# Patient Record
Sex: Male | Born: 1980 | Race: White | Hispanic: No | Marital: Single | Smoking: Current some day smoker
Health system: Southern US, Community
[De-identification: ages and names within clinical notes are randomized; demographics above are authoritative.]

## PROBLEM LIST (undated history)

## (undated) DIAGNOSIS — I10 Essential (primary) hypertension: Secondary | ICD-10-CM

## (undated) DIAGNOSIS — M329 Systemic lupus erythematosus, unspecified: Secondary | ICD-10-CM

## (undated) HISTORY — PX: EYE SURGERY: SHX253

---

## 2017-01-23 ENCOUNTER — Inpatient Hospital Stay (HOSPITAL_COMMUNITY)
Admission: EM | Admit: 2017-01-23 | Discharge: 2017-01-28 | DRG: 516 | Disposition: A | Discharge status: Home / Self Care | Payer: Self-pay | Attending: Orthopaedic Surgery | Admitting: Orthopaedic Surgery

## 2017-01-23 ENCOUNTER — Encounter (HOSPITAL_COMMUNITY): Payer: Self-pay | Admitting: *Deleted

## 2017-01-23 ENCOUNTER — Emergency Department (HOSPITAL_COMMUNITY): Payer: Self-pay

## 2017-01-23 DIAGNOSIS — D72829 Elevated white blood cell count, unspecified: Secondary | ICD-10-CM | POA: Diagnosis present

## 2017-01-23 DIAGNOSIS — S32409A Unspecified fracture of unspecified acetabulum, initial encounter for closed fracture: Secondary | ICD-10-CM | POA: Diagnosis present

## 2017-01-23 DIAGNOSIS — M329 Systemic lupus erythematosus, unspecified: Secondary | ICD-10-CM | POA: Diagnosis present

## 2017-01-23 DIAGNOSIS — F17213 Nicotine dependence, cigarettes, with withdrawal: Secondary | ICD-10-CM | POA: Diagnosis present

## 2017-01-23 DIAGNOSIS — Z9114 Patient's other noncompliance with medication regimen: Secondary | ICD-10-CM

## 2017-01-23 DIAGNOSIS — E872 Acidosis: Secondary | ICD-10-CM | POA: Diagnosis present

## 2017-01-23 DIAGNOSIS — Y92410 Unspecified street and highway as the place of occurrence of the external cause: Secondary | ICD-10-CM

## 2017-01-23 DIAGNOSIS — Z419 Encounter for procedure for purposes other than remedying health state, unspecified: Secondary | ICD-10-CM

## 2017-01-23 DIAGNOSIS — I10 Essential (primary) hypertension: Secondary | ICD-10-CM | POA: Diagnosis present

## 2017-01-23 DIAGNOSIS — Z8249 Family history of ischemic heart disease and other diseases of the circulatory system: Secondary | ICD-10-CM

## 2017-01-23 DIAGNOSIS — S32421A Displaced fracture of posterior wall of right acetabulum, initial encounter for closed fracture: Principal | ICD-10-CM

## 2017-01-23 DIAGNOSIS — S0081XA Abrasion of other part of head, initial encounter: Secondary | ICD-10-CM

## 2017-01-23 DIAGNOSIS — L930 Discoid lupus erythematosus: Secondary | ICD-10-CM

## 2017-01-23 DIAGNOSIS — Z880 Allergy status to penicillin: Secondary | ICD-10-CM

## 2017-01-23 DIAGNOSIS — R739 Hyperglycemia, unspecified: Secondary | ICD-10-CM | POA: Diagnosis present

## 2017-01-23 DIAGNOSIS — F1010 Alcohol abuse, uncomplicated: Secondary | ICD-10-CM

## 2017-01-23 DIAGNOSIS — S060X0A Concussion without loss of consciousness, initial encounter: Secondary | ICD-10-CM

## 2017-01-23 DIAGNOSIS — Z720 Tobacco use: Secondary | ICD-10-CM

## 2017-01-23 DIAGNOSIS — S0181XA Laceration without foreign body of other part of head, initial encounter: Secondary | ICD-10-CM | POA: Diagnosis present

## 2017-01-23 HISTORY — DX: Systemic lupus erythematosus, unspecified: M32.9

## 2017-01-23 HISTORY — DX: Essential (primary) hypertension: I10

## 2017-01-23 LAB — URINALYSIS, ROUTINE W REFLEX MICROSCOPIC
Bacteria, UA: NONE SEEN
Bilirubin Urine: NEGATIVE
GLUCOSE, UA: NEGATIVE mg/dL
KETONES UR: NEGATIVE mg/dL
LEUKOCYTES UA: NEGATIVE
Nitrite: NEGATIVE
PROTEIN: NEGATIVE mg/dL
RBC / HPF: NONE SEEN RBC/hpf (ref 0–5)
SQUAMOUS EPITHELIAL / LPF: NONE SEEN
Specific Gravity, Urine: 1.002 — ABNORMAL LOW (ref 1.005–1.030)
WBC UA: NONE SEEN WBC/hpf (ref 0–5)
pH: 6 (ref 5.0–8.0)

## 2017-01-23 LAB — COMPREHENSIVE METABOLIC PANEL
ALBUMIN: 4.1 g/dL (ref 3.5–5.0)
ALK PHOS: 85 U/L (ref 38–126)
ALT: 58 U/L (ref 17–63)
AST: 55 U/L — AB (ref 15–41)
Anion gap: 10 (ref 5–15)
BILIRUBIN TOTAL: 0.4 mg/dL (ref 0.3–1.2)
BUN: 9 mg/dL (ref 6–20)
CALCIUM: 9 mg/dL (ref 8.9–10.3)
CO2: 20 mmol/L — ABNORMAL LOW (ref 22–32)
Chloride: 108 mmol/L (ref 101–111)
Creatinine, Ser: 1.01 mg/dL (ref 0.61–1.24)
GFR calc Af Amer: >60 mL/min (ref >60)
GFR calc non Af Amer: >60 mL/min (ref >60)
GLUCOSE: 119 mg/dL — AB (ref 65–99)
Potassium: 3.3 mmol/L — ABNORMAL LOW (ref 3.5–5.1)
Sodium: 138 mmol/L (ref 135–145)
TOTAL PROTEIN: 7 g/dL (ref 6.5–8.1)

## 2017-01-23 LAB — I-STAT CG4 LACTIC ACID, ED: LACTIC ACID, VENOUS: 2.54 mmol/L — AB (ref 0.5–1.9)

## 2017-01-23 LAB — SAMPLE TO BLOOD BANK

## 2017-01-23 LAB — CBC
HCT: 46.4 % (ref 39.0–52.0)
Hemoglobin: 15.9 g/dL (ref 13.0–17.0)
MCH: 30.7 pg (ref 26.0–34.0)
MCHC: 34.3 g/dL (ref 30.0–36.0)
MCV: 89.6 fL (ref 78.0–100.0)
Platelets: 229 10*3/uL (ref 150–400)
RBC: 5.18 MIL/uL (ref 4.22–5.81)
RDW: 13.3 % (ref 11.5–15.5)
WBC: 12.6 10*3/uL — ABNORMAL HIGH (ref 4.0–10.5)

## 2017-01-23 LAB — PROTIME-INR
INR: 0.9
Prothrombin Time: 12.1 seconds (ref 11.4–15.2)

## 2017-01-23 LAB — MRSA PCR SCREENING: MRSA BY PCR: NEGATIVE

## 2017-01-23 LAB — CDS SEROLOGY

## 2017-01-23 LAB — LACTIC ACID, PLASMA: LACTIC ACID, VENOUS: 1 mmol/L (ref 0.5–1.9)

## 2017-01-23 LAB — ETHANOL: ALCOHOL ETHYL (B): 229 mg/dL — AB (ref <5)

## 2017-01-23 MED ORDER — FENTANYL CITRATE (PF) 100 MCG/2ML IJ SOLN
100.0000 ug | Freq: Once | INTRAMUSCULAR | Status: AC
  Administered 2017-01-23: 100 ug via INTRAVENOUS
  Filled 2017-01-23: qty 2

## 2017-01-23 MED ORDER — ONDANSETRON HCL 4 MG PO TABS: 4.0000 mg | ORAL_TABLET | Freq: Four times a day (QID) | ORAL | Status: DC | PRN

## 2017-01-23 MED ORDER — DEXTROSE-NACL 5-0.45 % IV SOLN
INTRAVENOUS | Status: DC
  Administered 2017-01-23: 15:00:00 via INTRAVENOUS

## 2017-01-23 MED ORDER — ACETAMINOPHEN 325 MG PO TABS
650.0000 mg | ORAL_TABLET | Freq: Four times a day (QID) | ORAL | Status: DC | PRN
  Administered 2017-01-25 – 2017-01-28 (×6): 650 mg via ORAL
  Filled 2017-01-23 (×6): qty 2

## 2017-01-23 MED ORDER — MORPHINE SULFATE (PF) 4 MG/ML IV SOLN
2.0000 mg | INTRAVENOUS | Status: DC | PRN
  Administered 2017-01-23 – 2017-01-26 (×16): 2 mg via INTRAVENOUS
  Filled 2017-01-23 (×16): qty 1

## 2017-01-23 MED ORDER — SODIUM CHLORIDE 0.9 % IV BOLUS (SEPSIS)
1000.0000 mL | Freq: Once | INTRAVENOUS | Status: AC
  Administered 2017-01-23: 1000 mL via INTRAVENOUS

## 2017-01-23 MED ORDER — ONDANSETRON HCL 4 MG/2ML IJ SOLN: 4.0000 mg | Freq: Four times a day (QID) | INTRAMUSCULAR | Status: DC | PRN

## 2017-01-23 MED ORDER — HYDRALAZINE HCL 20 MG/ML IJ SOLN
5.0000 mg | INTRAMUSCULAR | Status: DC | PRN
  Administered 2017-01-23: 5 mg via INTRAVENOUS
  Administered 2017-01-24 (×2): 10 mg via INTRAVENOUS
  Filled 2017-01-23 (×3): qty 1

## 2017-01-23 MED ORDER — OXYCODONE HCL 5 MG PO TABS
5.0000 mg | ORAL_TABLET | ORAL | Status: DC | PRN
  Administered 2017-01-23 (×2): 15 mg via ORAL
  Administered 2017-01-23: 10 mg via ORAL
  Administered 2017-01-24 (×2): 15 mg via ORAL
  Administered 2017-01-24: 10 mg via ORAL
  Administered 2017-01-24 – 2017-01-25 (×3): 15 mg via ORAL
  Administered 2017-01-25: 10 mg via ORAL
  Administered 2017-01-25: 5 mg via ORAL
  Administered 2017-01-25 – 2017-01-28 (×18): 15 mg via ORAL
  Filled 2017-01-23: qty 2
  Filled 2017-01-23 (×5): qty 3
  Filled 2017-01-23: qty 2
  Filled 2017-01-23 (×22): qty 3

## 2017-01-23 MED ORDER — ACETAMINOPHEN 650 MG RE SUPP: 650.0000 mg | Freq: Four times a day (QID) | RECTAL | Status: DC | PRN

## 2017-01-23 MED ORDER — ENOXAPARIN SODIUM 30 MG/0.3ML ~~LOC~~ SOLN
30.0000 mg | Freq: Two times a day (BID) | SUBCUTANEOUS | Status: DC
  Administered 2017-01-23 – 2017-01-28 (×9): 30 mg via SUBCUTANEOUS
  Filled 2017-01-23 (×9): qty 0.3

## 2017-01-23 MED ORDER — DOCUSATE SODIUM 100 MG PO CAPS
100.0000 mg | ORAL_CAPSULE | Freq: Two times a day (BID) | ORAL | Status: DC
  Administered 2017-01-23 – 2017-01-27 (×11): 100 mg via ORAL
  Filled 2017-01-23 (×12): qty 1

## 2017-01-23 MED ORDER — FENTANYL CITRATE (PF) 100 MCG/2ML IJ SOLN
25.0000 ug | Freq: Once | INTRAMUSCULAR | Status: AC
  Administered 2017-01-23: 25 ug via INTRAVENOUS
  Filled 2017-01-23: qty 2

## 2017-01-23 MED ORDER — LISINOPRIL 5 MG PO TABS
5.0000 mg | ORAL_TABLET | Freq: Every day | ORAL | Status: DC
  Administered 2017-01-24 – 2017-01-28 (×5): 5 mg via ORAL
  Filled 2017-01-23 (×5): qty 1

## 2017-01-23 MED ORDER — POLYETHYLENE GLYCOL 3350 17 G PO PACK
17.0000 g | PACK | Freq: Every day | ORAL | Status: DC
  Administered 2017-01-23 – 2017-01-27 (×5): 17 g via ORAL
  Filled 2017-01-23 (×6): qty 1

## 2017-01-23 MED ORDER — ADULT MULTIVITAMIN W/MINERALS CH
1.0000 | ORAL_TABLET | Freq: Every day | ORAL | Status: DC
  Administered 2017-01-23 – 2017-01-28 (×6): 1 via ORAL
  Filled 2017-01-23 (×6): qty 1

## 2017-01-23 MED ORDER — LORAZEPAM 1 MG PO TABS
1.0000 mg | ORAL_TABLET | Freq: Four times a day (QID) | ORAL | Status: DC | PRN
  Administered 2017-01-24: 1 mg via ORAL
  Filled 2017-01-23: qty 1

## 2017-01-23 MED ORDER — TETANUS-DIPHTH-ACELL PERTUSSIS 5-2.5-18.5 LF-MCG/0.5 IM SUSP
0.5000 mL | Freq: Once | INTRAMUSCULAR | Status: AC
  Administered 2017-01-23: 0.5 mL via INTRAMUSCULAR
  Filled 2017-01-23: qty 0.5

## 2017-01-23 MED ORDER — THIAMINE HCL 100 MG/ML IJ SOLN
100.0000 mg | Freq: Every day | INTRAMUSCULAR | Status: DC
  Filled 2017-01-23: qty 2

## 2017-01-23 MED ORDER — IOPAMIDOL (ISOVUE-300) INJECTION 61%
INTRAVENOUS | Status: AC
  Administered 2017-01-23: 100 mL
  Filled 2017-01-23: qty 100

## 2017-01-23 MED ORDER — METOPROLOL TARTRATE 5 MG/5ML IV SOLN
5.0000 mg | Freq: Four times a day (QID) | INTRAVENOUS | Status: DC | PRN
Start: 2017-01-23 — End: 2017-01-28
  Administered 2017-01-23 – 2017-01-25 (×3): 5 mg via INTRAVENOUS
  Filled 2017-01-23 (×3): qty 5

## 2017-01-23 MED ORDER — METHOCARBAMOL 500 MG PO TABS
1000.0000 mg | ORAL_TABLET | Freq: Four times a day (QID) | ORAL | Status: DC | PRN
  Administered 2017-01-23 – 2017-01-28 (×15): 1000 mg via ORAL
  Filled 2017-01-23 (×15): qty 2

## 2017-01-23 MED ORDER — METHOCARBAMOL 1000 MG/10ML IJ SOLN
1000.0000 mg | Freq: Four times a day (QID) | INTRAVENOUS | Status: DC | PRN
  Filled 2017-01-23: qty 10

## 2017-01-23 MED ORDER — FOLIC ACID 1 MG PO TABS
1.0000 mg | ORAL_TABLET | Freq: Every day | ORAL | Status: DC
  Administered 2017-01-23 – 2017-01-28 (×6): 1 mg via ORAL
  Filled 2017-01-23 (×6): qty 1

## 2017-01-23 MED ORDER — VITAMIN B-1 100 MG PO TABS
100.0000 mg | ORAL_TABLET | Freq: Every day | ORAL | Status: DC
  Administered 2017-01-23 – 2017-01-28 (×6): 100 mg via ORAL
  Filled 2017-01-23 (×6): qty 1

## 2017-01-23 MED ORDER — LORAZEPAM 2 MG/ML IJ SOLN: 1.0000 mg | Freq: Four times a day (QID) | INTRAMUSCULAR | Status: DC | PRN

## 2017-01-23 NOTE — ED Notes (Signed)
Patient offered food, patient declined.
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
## 2017-01-23 NOTE — Progress Notes (Signed)
Orthopedic Tech Progress Note Patient Details:  Johnny Rush 04/25/1981 [id number]  Ortho Devices Type of Ortho Device: Knee Immobilizer Ortho Device/Splint Interventions: Application   Saul Fordyce 01/23/2017, 10:16 AM

## 2017-01-23 NOTE — ED Notes (Signed)
Family at beside  
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
## 2017-01-23 NOTE — ED Notes (Signed)
Ortho tech paged for right knee immobilizer.

## 2017-01-24 ENCOUNTER — Inpatient Hospital Stay (HOSPITAL_COMMUNITY): Payer: Self-pay

## 2017-01-24 ENCOUNTER — Encounter (HOSPITAL_COMMUNITY): Payer: Self-pay | Admitting: Certified Registered"

## 2017-01-24 ENCOUNTER — Encounter (HOSPITAL_COMMUNITY)
Admission: EM | Disposition: A | Discharge status: Home / Self Care | Payer: Self-pay | Source: Home / Self Care | Attending: Orthopaedic Surgery

## 2017-01-24 ENCOUNTER — Inpatient Hospital Stay (HOSPITAL_COMMUNITY): Payer: Self-pay | Admitting: Critical Care Medicine

## 2017-01-24 DIAGNOSIS — S32421A Displaced fracture of posterior wall of right acetabulum, initial encounter for closed fracture: Secondary | ICD-10-CM

## 2017-01-24 DIAGNOSIS — M329 Systemic lupus erythematosus, unspecified: Secondary | ICD-10-CM

## 2017-01-24 DIAGNOSIS — S32441D Displaced fracture of posterior column [ilioischial] of right acetabulum, subsequent encounter for fracture with routine healing: Secondary | ICD-10-CM | POA: Diagnosis not present

## 2017-01-24 HISTORY — PX: ORIF ACETABULAR FRACTURE: SHX5029

## 2017-01-24 LAB — HIV ANTIBODY (ROUTINE TESTING W REFLEX): HIV SCREEN 4TH GENERATION: NONREACTIVE

## 2017-01-24 LAB — HEMOGLOBIN A1C
Hgb A1c MFr Bld: 5.2 % (ref 4.8–5.6)
MEAN PLASMA GLUCOSE: 103 mg/dL

## 2017-01-24 SURGERY — OPEN REDUCTION INTERNAL FIXATION (ORIF) ACETABULAR FRACTURE
Anesthesia: General | Laterality: Right

## 2017-01-24 MED ORDER — HYDROMORPHONE HCL 1 MG/ML IJ SOLN
INTRAMUSCULAR | Status: AC
  Administered 2017-01-24: 0.5 mg via INTRAVENOUS
  Filled 2017-01-24: qty 1

## 2017-01-24 MED ORDER — HYDROMORPHONE HCL 1 MG/ML IJ SOLN
INTRAMUSCULAR | Status: AC
  Administered 2017-01-24: 0.5 mg via INTRAVENOUS
  Filled 2017-01-24: qty 0.5

## 2017-01-24 MED ORDER — ESMOLOL HCL 100 MG/10ML IV SOLN
INTRAVENOUS | Status: AC
  Filled 2017-01-24: qty 10

## 2017-01-24 MED ORDER — LACTATED RINGERS IV SOLN
Freq: Once | INTRAVENOUS | Status: AC
  Administered 2017-01-24: 13:00:00 via INTRAVENOUS

## 2017-01-24 MED ORDER — PROPOFOL 10 MG/ML IV BOLUS
INTRAVENOUS | Status: DC | PRN
  Administered 2017-01-24: 150 mg via INTRAVENOUS

## 2017-01-24 MED ORDER — LIDOCAINE 2% (20 MG/ML) 5 ML SYRINGE
INTRAMUSCULAR | Status: AC
  Filled 2017-01-24: qty 5

## 2017-01-24 MED ORDER — ROCURONIUM BROMIDE 10 MG/ML (PF) SYRINGE
PREFILLED_SYRINGE | INTRAVENOUS | Status: AC
  Filled 2017-01-24: qty 5

## 2017-01-24 MED ORDER — SUGAMMADEX SODIUM 200 MG/2ML IV SOLN
INTRAVENOUS | Status: AC
  Filled 2017-01-24: qty 2

## 2017-01-24 MED ORDER — ESMOLOL HCL 100 MG/10ML IV SOLN
INTRAVENOUS | Status: DC | PRN
  Administered 2017-01-24: 20 mg via INTRAVENOUS

## 2017-01-24 MED ORDER — OXYCODONE HCL 5 MG/5ML PO SOLN: 5.0000 mg | Freq: Once | ORAL | Status: AC | PRN

## 2017-01-24 MED ORDER — MIDAZOLAM HCL 2 MG/2ML IJ SOLN
INTRAMUSCULAR | Status: DC | PRN
  Administered 2017-01-24: 2 mg via INTRAVENOUS

## 2017-01-24 MED ORDER — PHENYLEPHRINE HCL 10 MG/ML IJ SOLN
INTRAMUSCULAR | Status: DC | PRN
  Administered 2017-01-24 (×2): 120 ug via INTRAVENOUS

## 2017-01-24 MED ORDER — FENTANYL CITRATE (PF) 250 MCG/5ML IJ SOLN
INTRAMUSCULAR | Status: AC
  Filled 2017-01-24: qty 5

## 2017-01-24 MED ORDER — PROPOFOL 10 MG/ML IV BOLUS
INTRAVENOUS | Status: AC
  Filled 2017-01-24: qty 20

## 2017-01-24 MED ORDER — SUGAMMADEX SODIUM 200 MG/2ML IV SOLN
INTRAVENOUS | Status: DC | PRN
  Administered 2017-01-24: 180 mg via INTRAVENOUS

## 2017-01-24 MED ORDER — PHENOL 1.4 % MT LIQD: 1.0000 | OROMUCOSAL | Status: DC | PRN

## 2017-01-24 MED ORDER — ONDANSETRON HCL 4 MG/2ML IJ SOLN
INTRAMUSCULAR | Status: AC
  Filled 2017-01-24: qty 2

## 2017-01-24 MED ORDER — EPHEDRINE 5 MG/ML INJ
INTRAVENOUS | Status: AC
  Filled 2017-01-24: qty 10

## 2017-01-24 MED ORDER — ROCURONIUM BROMIDE 100 MG/10ML IV SOLN
INTRAVENOUS | Status: DC | PRN
  Administered 2017-01-24: 50 mg via INTRAVENOUS
  Administered 2017-01-24: 30 mg via INTRAVENOUS

## 2017-01-24 MED ORDER — LACTATED RINGERS IV SOLN
INTRAVENOUS | Status: DC | PRN
  Administered 2017-01-24 (×2): via INTRAVENOUS

## 2017-01-24 MED ORDER — HYDROMORPHONE HCL 1 MG/ML IJ SOLN
0.2500 mg | INTRAMUSCULAR | Status: DC | PRN
  Administered 2017-01-24 (×4): 0.5 mg via INTRAVENOUS

## 2017-01-24 MED ORDER — CEFAZOLIN SODIUM-DEXTROSE 2-3 GM-% IV SOLR
INTRAVENOUS | Status: DC | PRN
  Administered 2017-01-24: 2 g via INTRAVENOUS

## 2017-01-24 MED ORDER — MENTHOL 3 MG MT LOZG: 1.0000 | LOZENGE | OROMUCOSAL | Status: DC | PRN

## 2017-01-24 MED ORDER — 0.9 % SODIUM CHLORIDE (POUR BTL) OPTIME
TOPICAL | Status: DC | PRN
  Administered 2017-01-24: 1000 mL

## 2017-01-24 MED ORDER — FENTANYL CITRATE (PF) 100 MCG/2ML IJ SOLN
INTRAMUSCULAR | Status: DC | PRN
  Administered 2017-01-24 (×2): 50 ug via INTRAVENOUS
  Administered 2017-01-24 (×2): 100 ug via INTRAVENOUS
  Administered 2017-01-24: 50 ug via INTRAVENOUS
  Administered 2017-01-24: 100 ug via INTRAVENOUS
  Administered 2017-01-24: 50 ug via INTRAVENOUS

## 2017-01-24 MED ORDER — BACITRACIN-NEOMYCIN-POLYMYXIN 400-5-5000 EX OINT
TOPICAL_OINTMENT | Freq: Every day | CUTANEOUS | Status: DC
  Administered 2017-01-24: 1 via TOPICAL
  Administered 2017-01-25: 09:00:00 via TOPICAL
  Administered 2017-01-26 – 2017-01-28 (×3): 1 via TOPICAL
  Filled 2017-01-24 (×4): qty 1

## 2017-01-24 MED ORDER — OXYCODONE HCL 5 MG PO TABS
ORAL_TABLET | ORAL | Status: AC
  Administered 2017-01-24: 5 mg via ORAL
  Filled 2017-01-24: qty 1

## 2017-01-24 MED ORDER — MIDAZOLAM HCL 2 MG/2ML IJ SOLN
INTRAMUSCULAR | Status: AC
  Filled 2017-01-24: qty 2

## 2017-01-24 MED ORDER — CEFAZOLIN SODIUM-DEXTROSE 2-4 GM/100ML-% IV SOLN
INTRAVENOUS | Status: AC
  Filled 2017-01-24: qty 100

## 2017-01-24 MED ORDER — OXYCODONE HCL 5 MG PO TABS
5.0000 mg | ORAL_TABLET | Freq: Once | ORAL | Status: AC | PRN
  Administered 2017-01-24: 5 mg via ORAL

## 2017-01-24 MED ORDER — PHENYLEPHRINE 40 MCG/ML (10ML) SYRINGE FOR IV PUSH (FOR BLOOD PRESSURE SUPPORT)
PREFILLED_SYRINGE | INTRAVENOUS | Status: AC
  Filled 2017-01-24: qty 20

## 2017-01-24 MED ORDER — EPHEDRINE SULFATE 50 MG/ML IJ SOLN
INTRAMUSCULAR | Status: DC | PRN
  Administered 2017-01-24 (×2): 10 mg via INTRAVENOUS

## 2017-01-24 MED ORDER — LIDOCAINE HCL (CARDIAC) 20 MG/ML IV SOLN
INTRAVENOUS | Status: DC | PRN
  Administered 2017-01-24: 100 mg via INTRAVENOUS

## 2017-01-24 MED ORDER — ONDANSETRON HCL 4 MG/2ML IJ SOLN
INTRAMUSCULAR | Status: DC | PRN
  Administered 2017-01-24: 4 mg via INTRAVENOUS

## 2017-01-24 MED ORDER — PHENYLEPHRINE HCL 10 MG/ML IJ SOLN
INTRAVENOUS | Status: DC | PRN
  Administered 2017-01-24: 120 ug/min via INTRAVENOUS

## 2017-01-24 SURGICAL SUPPLY — 68 items
BIT DRILL 2.5 X LONG (BIT) ×1
BIT DRILL X LONG 2.5 (BIT) ×1 IMPLANT
BLADE CLIPPER SURG (BLADE) ×3 IMPLANT
COVER SURGICAL LIGHT HANDLE (MISCELLANEOUS) ×3 IMPLANT
DRAPE C-ARM 42X72 X-RAY (DRAPES) IMPLANT
DRAPE C-ARMOR (DRAPES) ×3 IMPLANT
DRAPE IMP U-DRAPE 54X76 (DRAPES) ×3 IMPLANT
DRAPE INCISE IOBAN 66X45 STRL (DRAPES) IMPLANT
DRAPE INCISE IOBAN 85X60 (DRAPES) ×3 IMPLANT
DRAPE ORTHO SPLIT 77X108 STRL (DRAPES) ×4
DRAPE SURG ORHT 6 SPLT 77X108 (DRAPES) ×2 IMPLANT
DRAPE U-SHAPE 47X51 STRL (DRAPES) ×3 IMPLANT
DRILL BIT X LONG 2.5 (BIT) ×2
DRSG ADAPTIC 3X8 NADH LF (GAUZE/BANDAGES/DRESSINGS) ×3 IMPLANT
DRSG AQUACEL AG ADV 3.5X14 (GAUZE/BANDAGES/DRESSINGS) ×3 IMPLANT
DRSG PAD ABDOMINAL 8X10 ST (GAUZE/BANDAGES/DRESSINGS) IMPLANT
DURAPREP 26ML APPLICATOR (WOUND CARE) ×6 IMPLANT
ELECT BLADE 4.0 EZ CLEAN MEGAD (MISCELLANEOUS) ×3
ELECT REM PT RETURN 9FT ADLT (ELECTROSURGICAL) ×3
ELECTRODE BLDE 4.0 EZ CLN MEGD (MISCELLANEOUS) ×1 IMPLANT
ELECTRODE REM PT RTRN 9FT ADLT (ELECTROSURGICAL) ×1 IMPLANT
EVACUATOR 1/8 PVC DRAIN (DRAIN) IMPLANT
GAUZE SPONGE 4X4 12PLY STRL (GAUZE/BANDAGES/DRESSINGS) ×3 IMPLANT
GLOVE BIOGEL PI IND STRL 8 (GLOVE) ×2 IMPLANT
GLOVE BIOGEL PI INDICATOR 8 (GLOVE) ×4
GLOVE ORTHO TXT STRL SZ7.5 (GLOVE) ×6 IMPLANT
GOWN STRL REUS W/ TWL LRG LVL3 (GOWN DISPOSABLE) ×1 IMPLANT
GOWN STRL REUS W/ TWL XL LVL3 (GOWN DISPOSABLE) ×1 IMPLANT
GOWN STRL REUS W/TWL 2XL LVL3 (GOWN DISPOSABLE) ×3 IMPLANT
GOWN STRL REUS W/TWL LRG LVL3 (GOWN DISPOSABLE) ×2
GOWN STRL REUS W/TWL XL LVL3 (GOWN DISPOSABLE) ×2
KIT BASIN OR (CUSTOM PROCEDURE TRAY) ×3 IMPLANT
KIT ROOM TURNOVER OR (KITS) ×3 IMPLANT
LOOP VESSEL MAXI BLUE (MISCELLANEOUS) IMPLANT
MANIFOLD NEPTUNE II (INSTRUMENTS) ×3 IMPLANT
NEEDLE MAYO TROCAR (NEEDLE) ×3 IMPLANT
NS IRRIG 1000ML POUR BTL (IV SOLUTION) ×3 IMPLANT
PACK TOTAL JOINT (CUSTOM PROCEDURE TRAY) ×3 IMPLANT
PACK UNIVERSAL I (CUSTOM PROCEDURE TRAY) IMPLANT
PAD ARMBOARD 7.5X6 YLW CONV (MISCELLANEOUS) ×6 IMPLANT
PLATE LCP RECON 3.5 8H/112 (Plate) ×3 IMPLANT
SCREW CORTEX 3.5 24MM (Screw) ×2 IMPLANT
SCREW CORTEX 3.5 26MM (Screw) ×2 IMPLANT
SCREW CORTEX 3.5 32MM (Screw) ×4 IMPLANT
SCREW CORTEX 3.5 34MM (Screw) ×2 IMPLANT
SCREW CORTEX 3.5 40MM (Screw) ×2 IMPLANT
SCREW LOCK CORT ST 3.5X24 (Screw) ×1 IMPLANT
SCREW LOCK CORT ST 3.5X26 (Screw) ×1 IMPLANT
SCREW LOCK CORT ST 3.5X32 (Screw) ×2 IMPLANT
SCREW LOCK CORT ST 3.5X34 (Screw) ×1 IMPLANT
SCREW LOCK CORT ST 3.5X40 (Screw) ×1 IMPLANT
SPONGE LAP 18X18 X RAY DECT (DISPOSABLE) IMPLANT
STAPLER VISISTAT 35W (STAPLE) ×3 IMPLANT
SUCTION FRAZIER HANDLE 10FR (MISCELLANEOUS) ×2
SUCTION TUBE FRAZIER 10FR DISP (MISCELLANEOUS) ×1 IMPLANT
SUT ETHIBOND NAB CT1 #1 30IN (SUTURE) ×3 IMPLANT
SUT VIC AB 0 CT1 27 (SUTURE) ×4
SUT VIC AB 0 CT1 27XBRD ANBCTR (SUTURE) ×2 IMPLANT
SUT VIC AB 1 CT1 27 (SUTURE) ×2
SUT VIC AB 1 CT1 27XBRD ANTBC (SUTURE) ×1 IMPLANT
SUT VIC AB 2-0 CT1 27 (SUTURE) ×4
SUT VIC AB 2-0 CT1 TAPERPNT 27 (SUTURE) ×2 IMPLANT
SUT VLOC 180 0 24IN GS25 (SUTURE) ×3 IMPLANT
SYR 5ML LL (SYRINGE) IMPLANT
TOWEL OR 17X24 6PK STRL BLUE (TOWEL DISPOSABLE) ×3 IMPLANT
TOWEL OR 17X26 10 PK STRL BLUE (TOWEL DISPOSABLE) ×3 IMPLANT
TRAY FOLEY W/METER SILVER 16FR (SET/KITS/TRAYS/PACK) ×3 IMPLANT
WATER STERILE IRR 1000ML POUR (IV SOLUTION) IMPLANT

## 2017-01-24 NOTE — Progress Notes (Signed)
Orthopedic Tech Progress Note Patient Details:  Johnny Rush 11-Feb-1981 [id number]  Ortho Devices Type of Ortho Device: Knee Immobilizer Ortho Device/Splint Location: RLE Ortho Device/Splint Interventions: Application   [name] 01/24/2017, 4:19 PM

## 2017-01-24 NOTE — Progress Notes (Signed)
   Subjective:   Procedure(s) (LRB): OPEN REDUCTION INTERNAL FIXATION (ORIF) ACETABULAR FRACTURE (Right) Patient reports pain as severe.  Patient and sister expressed problems with pain meds, spilled urinal on himself.  C/O left side ribs sore and right hip pain.   Objective: Vital signs in last 24 hours: Temp:  [98.2 F (36.8 C)-98.6 F (37 C)] 98.2 F (36.8 C) (05/02 0616) Pulse Rate:  [71-110] 85 (05/02 0616) Resp:  [10-20] 17 (05/02 0616) BP: (126-180)/(76-119) 180/92 (05/02 0616) SpO2:  [94 %-99 %] 99 % (05/02 0616)  Intake/Output from previous day: 05/01 0701 - 05/02 0700 In: 405.8 [P.O.:360; I.V.:45.8] Out: 1050 [Urine:1050] Intake/Output this shift: No intake/output data recorded.   Recent Labs  01/23/17 0419  HGB 15.9    Recent Labs  01/23/17 0419  WBC 12.6*  RBC 5.18  HCT 46.4  PLT 229    Recent Labs  01/23/17 0419  NA 138  K 3.3*  CL 108  CO2 20*  BUN 9  CREATININE 1.01  GLUCOSE 119*  CALCIUM 9.0    Recent Labs  01/23/17 0419  INR 0.90    Neurologically intact sciatic sensory and motor intact.  No results found.  Assessment/Plan:   Procedure(s) (LRB): OPEN REDUCTION INTERNAL FIXATION (ORIF) ACETABULAR FRACTURE (Right) Plan:   Surgery today.  Risks discussed including AVN, hip OA, problems with screws.  Eldred Manges 01/24/2017, 7:46 AM

## 2017-01-24 NOTE — Progress Notes (Signed)
Patient came back from surgery around 1630. Patient stated that he had a large bruise on his arm upper left arm that was not present before he went to surgery. Nurse will notify MD. Nurse will continue to monitor

## 2017-01-24 NOTE — Anesthesia Procedure Notes (Signed)
Procedure Name: Intubation Date/Time: 01/24/2017 1:06 PM Performed by: Sampson Si E Pre-anesthesia Checklist: Patient identified, Emergency Drugs available, Suction available and Patient being monitored Patient Re-evaluated:Patient Re-evaluated prior to inductionOxygen Delivery Method: Circle System Utilized Preoxygenation: Pre-oxygenation with 100% oxygen Intubation Type: IV induction Ventilation: Mask ventilation without difficulty Laryngoscope Size: Mac and 4 Grade View: Grade I Tube type: Oral Tube size: 7.5 mm Number of attempts: 1 Airway Equipment and Method: Stylet Placement Confirmation: ETT inserted through vocal cords under direct vision,  positive ETCO2 and breath sounds checked- equal and bilateral Secured at: 21 cm Tube secured with: Tape Dental Injury: Teeth and Oropharynx as per pre-operative assessment

## 2017-01-24 NOTE — Op Note (Signed)
Preop diagnosis: Right hip posterior wall comminuted acetabular fracture, closed area  Postop diagnosis: Same  Procedure: Open reduction internal fixation right posterior comminuted acetabular wall fracture.  Surgeon: Annell Greening M.D.  Assistant: Zonia Kief PA-C medically necessary and present for the entire procedure  Anesthesia general  EBL: See anesthetic record estimated less than 200 mL.  Implants Synthes compression plate with the screws 7  Procedure after induction general anesthesia patient placed on the [facility] table for fluoroscopic visualization in the lateral position with axillary roll in the semilateral position slightly turned a little bit more prone. 10:15 drape was applied the clippers were used for the hair over the buttocks. DuraPrep down close the ankle and usual impervious stockinette split sheets drapes sterile skin marker and Betadine Steri-Drape 2 sealing the skin preoperative Ancef timeout procedure completed. A Coker Laugenbach posterior incision was made. Piriformis was tagged and elevated protecting the sciatic nerve which was identified. Posterior comminuted wall fragments were reduced there was 1 large fragment a second smaller fragment which is initially removed once a large fragment was placed a small fragment was able to be keyed in and impacted into position underneath the position for the plate. Synthes plate was custom the bent to fit the posterior wall extending down initial tuberosity were initial screw was placed checked under fluoroscopy second screw placed distally and then proximal screws were placed above the joint pulling the plate down directly to the pelvis and compressing the fragment in anatomic position which was held initially with the reduction with the pig sticker and then secured with a K wire. K wire was removed is plate was tightened down. Proximal 3 screws were bicortical and a second screw was placed bicortically slightly more posterior  avoiding the joint and Kitching the medial wall to additionally secure the largest fragment. Numerous fluoroscopic pictures were taken the trying to reproduce at [place] views. Proximal all screws were tightened down to the plate was sitting down on the cortex. Final spot pictures were taken copious irrigation operative field was dry sciatic nerve was protected. The small Hohmann been placed in the sciatic notch to help protect the ulnar nerve. Piriformis was repaired gluteus medius tendon. Operative field was dry and tensor fascia was repaired as well as gluteus maximus the fascia. 2-0 Vicryl subtendinous tissue skin staple closure postop dressing and transferred recovery room. Patient will be protective weightbearing touchdown weightbearing only for a number weeks. Patient tolerated procedure well and transferred to the recovery room.

## 2017-01-24 NOTE — Anesthesia Preprocedure Evaluation (Signed)
Anesthesia Evaluation  Patient identified by MRN, date of birth, ID band Patient awake    Reviewed: Allergy & Precautions, NPO status , Unable to perform ROS - Chart review only  Airway Mallampati: II   Neck ROM: Full    Dental  (+) Poor Dentition, Missing, Dental Advisory Given   Pulmonary Current Smoker,    breath sounds clear to auscultation       Cardiovascular hypertension,  Rhythm:Regular Rate:Normal     Neuro/Psych    GI/Hepatic (+)     substance abuse  alcohol use,   Endo/Other    Renal/GU      Musculoskeletal   Abdominal   Peds  Hematology   Anesthesia Other Findings   Reproductive/Obstetrics                             Anesthesia Physical Anesthesia Plan  ASA: II  Anesthesia Plan: General   Post-op Pain Management:    Induction: Intravenous  Airway Management Planned: Oral ETT  Additional Equipment:   Intra-op Plan:   Post-operative Plan: Extubation in OR  Informed Consent: I have reviewed the patients History and Physical, chart, labs and discussed the procedure including the risks, benefits and alternatives for the proposed anesthesia with the patient or authorized representative who has indicated his/her understanding and acceptance.   Dental advisory given  Plan Discussed with:   Anesthesia Plan Comments:         Anesthesia Quick Evaluation

## 2017-01-24 NOTE — Brief Op Note (Signed)
01/23/2017 - 01/24/2017  3:47 PM  PATIENT:  Johnny Rush.  36 y.o. male  PRE-OPERATIVE DIAGNOSIS:  RIGHT POSTERIOR ACETABULAR FRACTURE  POST-OPERATIVE DIAGNOSIS:  RIGHT POSTERIOR ACETABULAR FRACTURE  PROCEDURE:  Procedure(s): OPEN REDUCTION INTERNAL FIXATION (ORIF) ACETABULAR FRACTURE (Right)  SURGEON:  Surgeon(s) and Role:    * Eldred Manges, MD - Primary  PHYSICIAN ASSISTANT: Zonia Kief    ANESTHESIA:   general  EBL:  Total I/O In: 1000 [I.V.:1000] Out: 500 [Urine:100; Blood:400]  BLOOD ADMINISTERED:none  DRAINS: none   LOCAL MEDICATIONS USED:  NONE  SPECIMEN:  No Specimen  DISPOSITION OF SPECIMEN:  N/A  COUNTS:  YES  TOURNIQUET:  * No tourniquets in log *  DICTATION: .Dragon Dictation  PLAN OF CARE: Admit to inpatient   PATIENT DISPOSITION:  PACU - hemodynamically stable.

## 2017-01-24 NOTE — Interval H&P Note (Signed)
History and Physical Interval Note:  01/24/2017 12:50 PM  Johnny Rush.  has presented today for surgery, with the diagnosis of RIGHT POSTERIOR ACETABULAR FRACTURE  The various methods of treatment have been discussed with the patient and family. After consideration of risks, benefits and other options for treatment, the patient has consented to  Procedure(s): OPEN REDUCTION INTERNAL FIXATION (ORIF) ACETABULAR FRACTURE (Right) as a surgical intervention .  The patient's history has been reviewed, patient examined, no change in status, stable for surgery.  I have reviewed the patient's chart and labs.  Questions were answered to the patient's satisfaction.     Eldred Manges
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
## 2017-01-24 NOTE — Transfer of Care (Signed)
Immediate Anesthesia Transfer of Care Note  Patient: Johnny Rush.  Procedure(s) Performed: Procedure(s): OPEN REDUCTION INTERNAL FIXATION (ORIF) ACETABULAR FRACTURE (Right)  Patient Location: PACU  Anesthesia Type:General  Level of Consciousness: awake and oriented  Airway & Oxygen Therapy: Patient Spontanous Breathing and Patient connected to nasal cannula oxygen  Post-op Assessment: Report given to RN and Patient moving all extremities X 4  Post vital signs: Reviewed and stable  Last Vitals:  Vitals:   01/24/17 0616 01/24/17 1139  BP: (!) 180/92 (!) 157/84  Pulse: 85 93  Resp: 17 15  Temp: 36.8 C 36.7 C    Last Pain:  Vitals:   01/24/17 1139  TempSrc: Oral  PainSc:          Complications: No apparent anesthesia complications

## 2017-01-25 ENCOUNTER — Encounter (HOSPITAL_COMMUNITY): Payer: Self-pay | Admitting: Orthopaedic Surgery

## 2017-01-25 LAB — BASIC METABOLIC PANEL
Anion gap: 8 (ref 5–15)
BUN: 8 mg/dL (ref 6–20)
CALCIUM: 8.3 mg/dL — AB (ref 8.9–10.3)
CHLORIDE: 100 mmol/L — AB (ref 101–111)
CO2: 27 mmol/L (ref 22–32)
CREATININE: 1.11 mg/dL (ref 0.61–1.24)
GFR calc non Af Amer: >60 mL/min (ref >60)
Glucose, Bld: 109 mg/dL — ABNORMAL HIGH (ref 65–99)
Potassium: 4.1 mmol/L (ref 3.5–5.1)
SODIUM: 135 mmol/L (ref 135–145)

## 2017-01-25 LAB — CBC
HCT: 40.7 % (ref 39.0–52.0)
Hemoglobin: 13.4 g/dL (ref 13.0–17.0)
MCH: 30.4 pg (ref 26.0–34.0)
MCHC: 32.9 g/dL (ref 30.0–36.0)
MCV: 92.3 fL (ref 78.0–100.0)
PLATELETS: 197 10*3/uL (ref 150–400)
RBC: 4.41 MIL/uL (ref 4.22–5.81)
RDW: 13.8 % (ref 11.5–15.5)
WBC: 11.8 10*3/uL — ABNORMAL HIGH (ref 4.0–10.5)

## 2017-01-25 MED ORDER — ZOLPIDEM TARTRATE 5 MG PO TABS
5.0000 mg | ORAL_TABLET | Freq: Every evening | ORAL | Status: DC | PRN
  Administered 2017-01-25 – 2017-01-27 (×4): 5 mg via ORAL
  Filled 2017-01-25 (×4): qty 1
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
## 2017-01-25 NOTE — Progress Notes (Addendum)
   Subjective: 1 Day Post-Op Procedure(s) (LRB): OPEN REDUCTION INTERNAL FIXATION (ORIF) ACETABULAR FRACTURE (Right) Patient reports pain as moderate and severe.    Objective: Vital signs in last 24 hours: Temp:  [97.7 F (36.5 C)-98.7 F (37.1 C)] 98.5 F (36.9 C) (05/03 0436) Pulse Rate:  [93-123] 98 (05/03 0436) Resp:  [12-16] 14 (05/03 0436) BP: (135-165)/(68-94) 135/72 (05/03 0436) SpO2:  [94 %-100 %] 98 % (05/03 0436)  Intake/Output from previous day: 05/02 0701 - 05/03 0700 In: 1227 [P.O.:222; I.V.:1005] Out: 2300 [Urine:1900; Blood:400] Intake/Output this shift: No intake/output data recorded.   Recent Labs  01/23/17 0419 01/25/17 0440  HGB 15.9 13.4    Recent Labs  01/23/17 0419 01/25/17 0440  WBC 12.6* 11.8*  RBC 5.18 4.41  HCT 46.4 40.7  PLT 229 197    Recent Labs  01/23/17 0419 01/25/17 0440  NA 138 135  K 3.3* 4.1  CL 108 100*  CO2 20* 27  BUN 9 8  CREATININE 1.01 1.11  GLUCOSE 119* 109*  CALCIUM 9.0 8.3*    Recent Labs  01/23/17 0419  INR 0.90    Neurologically intact Dg Pelvis Comp Min 3v  Result Date: 01/24/2017 CLINICAL DATA:  36 year old male undergoing ORIF of right acetabulum fracture EXAM: JUDET PELVIS - 3+ VIEW; DG C-ARM 61-120 MIN COMPARISON:  CT Abdomen and Pelvis 07-May-202018 FINDINGS: 3 intraoperative fluoroscopic spot views demonstrate malleable plate and screws about the right acetabulum. A superimposed single cortical screw is also in place. No adverse hardware features identified. On image 2 comminution fragment alignment about the superior acetabulum appears improved. FLUOROSCOPY TIME:  0 minutes 8 seconds IMPRESSION: ORIF of the right acetabulum with no adverse features identified. Electronically Signed   By: Odessa FlemingH  Eastham M.D.   On: 01/24/2017 15:07   Dg C-arm 1-60 Min  Result Date: 01/24/2017 CLINICAL DATA:  36 year old male undergoing ORIF of right acetabulum fracture EXAM: JUDET PELVIS - 3+ VIEW; DG C-ARM 61-120 MIN  COMPARISON:  CT Abdomen and Pelvis 07-May-202018 FINDINGS: 3 intraoperative fluoroscopic spot views demonstrate malleable plate and screws about the right acetabulum. A superimposed single cortical screw is also in place. No adverse hardware features identified. On image 2 comminution fragment alignment about the superior acetabulum appears improved. FLUOROSCOPY TIME:  0 minutes 8 seconds IMPRESSION: ORIF of the right acetabulum with no adverse features identified. Electronically Signed   By: Odessa FlemingH  Spengler M.D.   On: 01/24/2017 15:07    Assessment/Plan: 1 Day Post-Op Procedure(s) (LRB): OPEN REDUCTION INTERNAL FIXATION (ORIF) ACETABULAR FRACTURE (Right) Up with therapy   TDWB  . Ativan d/ced.    Eldred MangesMark C Bertie Mcconathy 01/25/2017, 8:05 AM
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
## 2017-01-25 NOTE — Clinical Social Work Note (Signed)
CSW consulted for SNF placement. P/T & O/T rec home health PT. Pt to d/c to parents home. RNCM aware and following for d/c needs. CSW signing off as no further SW needs identified. Please reconsult if new SW needs arise.   Corlis HoveJeneya Mitzy Naron, LCSWA, LCASA  Clinical Social Work [phone]
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
## 2017-01-26 LAB — BASIC METABOLIC PANEL
Anion gap: 9 (ref 5–15)
BUN: 9 mg/dL (ref 6–20)
CHLORIDE: 97 mmol/L — AB (ref 101–111)
CO2: 28 mmol/L (ref 22–32)
Calcium: 8.4 mg/dL — ABNORMAL LOW (ref 8.9–10.3)
Creatinine, Ser: 1.02 mg/dL (ref 0.61–1.24)
GFR calc Af Amer: >60 mL/min (ref >60)
GFR calc non Af Amer: >60 mL/min (ref >60)
GLUCOSE: 109 mg/dL — AB (ref 65–99)
POTASSIUM: 3.9 mmol/L (ref 3.5–5.1)
Sodium: 134 mmol/L — ABNORMAL LOW (ref 135–145)

## 2017-01-26 LAB — CBC
HEMATOCRIT: 41.3 % (ref 39.0–52.0)
HEMOGLOBIN: 13.2 g/dL (ref 13.0–17.0)
MCH: 29.5 pg (ref 26.0–34.0)
MCHC: 32 g/dL (ref 30.0–36.0)
MCV: 92.4 fL (ref 78.0–100.0)
Platelets: 209 10*3/uL (ref 150–400)
RBC: 4.47 MIL/uL (ref 4.22–5.81)
RDW: 13.6 % (ref 11.5–15.5)
WBC: 9.8 10*3/uL (ref 4.0–10.5)

## 2017-01-26 MED ORDER — METHOCARBAMOL 750 MG PO TABS: 750.0000 mg | ORAL_TABLET | Freq: Four times a day (QID) | ORAL | 0 refills | Status: DC | PRN

## 2017-01-26 MED ORDER — OXYCODONE-ACETAMINOPHEN 10-325 MG PO TABS: 1.0000 | ORAL_TABLET | Freq: Four times a day (QID) | ORAL | 0 refills | Status: DC | PRN

## 2017-01-26 MED ORDER — ASPIRIN EC 325 MG PO TBEC: 325.0000 mg | DELAYED_RELEASE_TABLET | Freq: Every day | ORAL | 0 refills | Status: DC

## 2017-01-26 MED ORDER — LISINOPRIL 5 MG PO TABS: 5.0000 mg | ORAL_TABLET | Freq: Every day | ORAL | 0 refills | Status: DC
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
## 2017-01-26 NOTE — Discharge Instructions (Signed)
ORTHOPEDIC DISCHARGE INSTRUCTIONS  -Strict touchdown weightbearing right lower extremity  -Okay to shower but no tub soaking.  -Daily dry dressing changes with 4 x 4 gauze and tape. Do not apply any creams or ointments to your incision. Hip staples will be removed possibly at 2 week postop appointment.  -No driving, lifting, pushing, climbing stairs

## 2017-01-26 NOTE — Anesthesia Postprocedure Evaluation (Addendum)
Anesthesia Post Note  Patient: Johnny PicketBrian J Teague Rush.  Procedure(s) Performed: Procedure(s) (LRB): OPEN REDUCTION INTERNAL FIXATION (ORIF) ACETABULAR FRACTURE (Right)  Patient location during evaluation: PACU Anesthesia Type: General Level of consciousness: awake and alert Pain management: pain level controlled Vital Signs Assessment: post-procedure vital signs reviewed and stable Respiratory status: spontaneous breathing, nonlabored ventilation, respiratory function stable and patient connected to nasal cannula oxygen Cardiovascular status: blood pressure returned to baseline and stable Postop Assessment: no signs of nausea or vomiting Anesthetic complications: no       Last Vitals:  Vitals:   01/25/17 2211 01/26/17 0622  BP: 127/76 129/70  Pulse: 89 96  Resp: 16 16  Temp: 37.3 C 37.5 C    Last Pain:  Vitals:   01/26/17 0622  TempSrc: Oral  PainSc:                  Taylia Berber,JAMES TERRILL
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
## 2017-01-27 LAB — CBC
HEMATOCRIT: 39.9 % (ref 39.0–52.0)
HEMOGLOBIN: 13.1 g/dL (ref 13.0–17.0)
MCH: 30.3 pg (ref 26.0–34.0)
MCHC: 32.8 g/dL (ref 30.0–36.0)
MCV: 92.1 fL (ref 78.0–100.0)
Platelets: 253 10*3/uL (ref 150–400)
RBC: 4.33 MIL/uL (ref 4.22–5.81)
RDW: 13.5 % (ref 11.5–15.5)
WBC: 11.4 10*3/uL — AB (ref 4.0–10.5)

## 2017-01-27 NOTE — Progress Notes (Signed)
Subjective: Patient stable.  Pain is controlled.   Objective: Vital signs in last 24 hours: Temp:  [97.6 F (36.4 C)-100 F (37.8 C)] 97.6 F (36.4 C) (05/05 0513) Pulse Rate:  [66-111] 66 (05/05 0513) Resp:  [17] 17 (05/04 1420) BP: (129-146)/(76-99) 146/82 (05/05 0513) SpO2:  [98 %-100 %] 100 % (05/05 0513)  Intake/Output from previous day: 05/04 0701 - 05/05 0700 In: 960 [P.O.:960] Out: 550 [Urine:550] Intake/Output this shift: No intake/output data recorded.  Exam:  Dorsiflexion/Plantar flexion intact  Labs:  Recent Labs  01/25/17 0440 01/26/17 0546 01/27/17 0802  HGB 13.4 13.2 13.1    Recent Labs  01/26/17 0546 01/27/17 0802  WBC 9.8 11.4*  RBC 4.47 4.33  HCT 41.3 39.9  PLT 209 253    Recent Labs  01/25/17 0440 01/26/17 0546  NA 135 134*  K 4.1 3.9  CL 100* 97*  CO2 27 28  BUN 8 9  CREATININE 1.11 1.02  GLUCOSE 109* 109*  CALCIUM 8.3* 8.4*   No results for input(s): LABPT, INR in the last 72 hours.  Assessment/Plan: Plan at this time is to discharge home tomorrow.  Patient has more support at home tomorrow and will be better able to manage his situation.  Currently his pain is well-controlled.   Marrianne MoodG Scott Khylon Davies 01/27/2017, 9:34 AM
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
## 2017-01-28 NOTE — Progress Notes (Signed)
Patient doing recently well.  Pain is controlled Leg lengths equal.  Foot dorsiflexion plantar flexion intact bilaterally Plan discharge today Added Ambien to be taken at night release for 10 days.  Not to be taken with oxycodone.  He has been taking that here in the hospital
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
## 2017-02-06 ENCOUNTER — Ambulatory Visit (INDEPENDENT_AMBULATORY_CARE_PROVIDER_SITE_OTHER): Payer: Self-pay | Admitting: Orthopaedic Surgery

## 2017-02-06 ENCOUNTER — Ambulatory Visit (INDEPENDENT_AMBULATORY_CARE_PROVIDER_SITE_OTHER): Payer: Self-pay

## 2017-02-06 VITALS — BP 155/105 | HR 73 | Ht 70.0 in | Wt 230.0 lb

## 2017-02-06 DIAGNOSIS — M25551 Pain in right hip: Secondary | ICD-10-CM

## 2017-02-06 DIAGNOSIS — S32441D Displaced fracture of posterior column [ilioischial] of right acetabulum, subsequent encounter for fracture with routine healing: Secondary | ICD-10-CM

## 2017-02-06 MED ORDER — ASPIRIN EC 325 MG PO TBEC: 325.0000 mg | DELAYED_RELEASE_TABLET | Freq: Every day | ORAL | 0 refills | Status: DC

## 2017-02-06 MED ORDER — LISINOPRIL 5 MG PO TABS: 5.0000 mg | ORAL_TABLET | Freq: Every day | ORAL | 0 refills | Status: DC
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
## 2017-02-14 ENCOUNTER — Telehealth (INDEPENDENT_AMBULATORY_CARE_PROVIDER_SITE_OTHER): Payer: Self-pay | Admitting: Orthopaedic Surgery

## 2017-02-14 MED ORDER — TRAMADOL HCL 50 MG PO TABS: 50.0000 mg | ORAL_TABLET | Freq: Three times a day (TID) | ORAL | 0 refills | Status: DC | PRN

## 2017-02-14 NOTE — Telephone Encounter (Signed)
Patient called read note to patient from Dr Ophelia CharterYates. Patient will check with pharmacy.

## 2017-02-14 NOTE — Telephone Encounter (Signed)
Please see below and advise.   I entered Ultram and called to pharmacy.  I called patient and advised. He is concerned about only being a few weeks out from surgery and being dropped down so rapidly from pain medication and muscle relaxers. He states that he has been taking the Percocet 5/325 which was a drop from his original RX and that has been going okay. He does notice that he is having to take it as prescribed to stay on top of the pain. He is concerned about the relief that he may get from Ultram. He is also concerned that he is not being prescribed the muscle relaxer.   I did advise patient that Dr. Ophelia CharterYates is in surgery this afternoon. He would like for me to speak with him about his concerns. He is going to try the ultram as he is out of his other medications.

## 2017-02-14 NOTE — Telephone Encounter (Signed)
Patient called needing Rx refilled (Percocet and Robaxin) Patient said he is out of both Rx. The number to contact patient is [phone].

## 2017-02-14 NOTE — Telephone Encounter (Signed)
Ok to refill robaxin. thanks

## 2017-02-14 NOTE — Telephone Encounter (Signed)
Please advise 

## 2017-02-14 NOTE — Telephone Encounter (Signed)
Send in ultram    # 30   1 po  Tid prn pain. Time to get off percocet and robaxin.

## 2017-02-15 MED ORDER — METHOCARBAMOL 750 MG PO TABS: 750.0000 mg | ORAL_TABLET | Freq: Four times a day (QID) | ORAL | 0 refills | Status: DC | PRN

## 2017-02-15 NOTE — Telephone Encounter (Signed)
I called patient and advised muscle relaxer has been sent to pharmacy.

## 2017-02-23 NOTE — Addendum Note (Signed)
Addendum  created 02/23/17 1435 by Kiani Wurtzel, MD   Sign clinical note    

## 2017-03-09 ENCOUNTER — Ambulatory Visit (INDEPENDENT_AMBULATORY_CARE_PROVIDER_SITE_OTHER): Payer: Self-pay | Admitting: Orthopaedic Surgery

## 2017-03-09 ENCOUNTER — Encounter (INDEPENDENT_AMBULATORY_CARE_PROVIDER_SITE_OTHER): Payer: Self-pay | Admitting: Orthopaedic Surgery

## 2017-03-09 VITALS — BP 143/97 | HR 88 | Ht 70.0 in | Wt 220.0 lb

## 2017-03-09 DIAGNOSIS — S32441D Displaced fracture of posterior column [ilioischial] of right acetabulum, subsequent encounter for fracture with routine healing: Secondary | ICD-10-CM
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
## 2017-04-03 ENCOUNTER — Encounter (INDEPENDENT_AMBULATORY_CARE_PROVIDER_SITE_OTHER): Payer: Self-pay | Admitting: Orthopaedic Surgery

## 2017-04-03 ENCOUNTER — Ambulatory Visit (INDEPENDENT_AMBULATORY_CARE_PROVIDER_SITE_OTHER): Payer: Self-pay | Admitting: Orthopaedic Surgery

## 2017-04-03 VITALS — BP 159/108 | HR 92

## 2017-04-03 DIAGNOSIS — S32441D Displaced fracture of posterior column [ilioischial] of right acetabulum, subsequent encounter for fracture with routine healing: Secondary | ICD-10-CM

## 2017-04-03 MED ORDER — LISINOPRIL 5 MG PO TABS: 5.0000 mg | ORAL_TABLET | Freq: Every day | ORAL | 0 refills | Status: AC
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
## 2019-02-24 DEATH — deceased
